# Patient Record
Sex: Male | Born: 1985 | Race: White | Hispanic: No | Marital: Married | Smoking: Never smoker
Health system: Southern US, Community
[De-identification: ages and names within clinical notes are randomized; demographics above are authoritative.]

## PROBLEM LIST (undated history)

## (undated) HISTORY — PX: MOUTH SURGERY: SHX715

---

## 2014-09-08 ENCOUNTER — Encounter (HOSPITAL_BASED_OUTPATIENT_CLINIC_OR_DEPARTMENT_OTHER): Payer: Self-pay | Admitting: *Deleted

## 2014-09-08 ENCOUNTER — Emergency Department (HOSPITAL_BASED_OUTPATIENT_CLINIC_OR_DEPARTMENT_OTHER)
Admission: EM | Admit: 2014-09-08 | Discharge: 2014-09-08 | Disposition: A | Discharge status: Home / Self Care | Payer: BLUE CROSS/BLUE SHIELD | Attending: Emergency Medicine | Admitting: Emergency Medicine

## 2014-09-08 ENCOUNTER — Emergency Department (HOSPITAL_BASED_OUTPATIENT_CLINIC_OR_DEPARTMENT_OTHER): Payer: BLUE CROSS/BLUE SHIELD

## 2014-09-08 DIAGNOSIS — R112 Nausea with vomiting, unspecified: Secondary | ICD-10-CM | POA: Insufficient documentation

## 2014-09-08 DIAGNOSIS — N200 Calculus of kidney: Secondary | ICD-10-CM

## 2014-09-08 DIAGNOSIS — R1031 Right lower quadrant pain: Secondary | ICD-10-CM | POA: Insufficient documentation

## 2014-09-08 DIAGNOSIS — R630 Anorexia: Secondary | ICD-10-CM | POA: Diagnosis not present

## 2014-09-08 DIAGNOSIS — R109 Unspecified abdominal pain: Secondary | ICD-10-CM | POA: Diagnosis present

## 2014-09-08 LAB — URINE MICROSCOPIC-ADD ON

## 2014-09-08 LAB — URINALYSIS, ROUTINE W REFLEX MICROSCOPIC
Bilirubin Urine: NEGATIVE
Glucose, UA: NEGATIVE mg/dL
Ketones, ur: 15 mg/dL — AB
Leukocytes, UA: NEGATIVE
NITRITE: NEGATIVE
PROTEIN: NEGATIVE mg/dL
Specific Gravity, Urine: 1.024 (ref 1.005–1.030)
Urobilinogen, UA: 0.2 mg/dL (ref 0.0–1.0)
pH: 7 (ref 5.0–8.0)

## 2014-09-08 LAB — BASIC METABOLIC PANEL
Anion gap: 6 (ref 5–15)
BUN: 23 mg/dL (ref 6–23)
CHLORIDE: 107 meq/L (ref 96–112)
CO2: 24 mmol/L (ref 19–32)
CREATININE: 1.18 mg/dL (ref 0.50–1.35)
Calcium: 9.2 mg/dL (ref 8.4–10.5)
GFR calc Af Amer: >90 mL/min (ref >90)
GFR, EST NON AFRICAN AMERICAN: 83 mL/min — AB (ref >90)
Glucose, Bld: 131 mg/dL — ABNORMAL HIGH (ref 70–99)
POTASSIUM: 3.7 mmol/L (ref 3.5–5.1)
Sodium: 137 mmol/L (ref 135–145)

## 2014-09-08 MED ORDER — FENTANYL CITRATE 0.05 MG/ML IJ SOLN
50.0000 ug | Freq: Once | INTRAMUSCULAR | Status: AC
  Administered 2014-09-08: 50 ug via INTRAVENOUS
  Filled 2014-09-08: qty 2

## 2014-09-08 MED ORDER — ONDANSETRON 8 MG PO TBDP
ORAL_TABLET | ORAL | Status: AC
  Filled 2014-09-08: qty 1

## 2014-09-08 MED ORDER — KETOROLAC TROMETHAMINE 30 MG/ML IJ SOLN
30.0000 mg | Freq: Once | INTRAMUSCULAR | Status: AC
  Administered 2014-09-08: 30 mg via INTRAVENOUS
  Filled 2014-09-08: qty 1

## 2014-09-08 MED ORDER — HYDROCODONE-ACETAMINOPHEN 5-325 MG PO TABS: 1.0000 | ORAL_TABLET | ORAL | Status: AC | PRN

## 2014-09-08 MED ORDER — FENTANYL CITRATE 0.05 MG/ML IJ SOLN
50.0000 ug | Freq: Once | INTRAMUSCULAR | Status: AC
  Administered 2014-09-08: 50 ug via INTRAVENOUS

## 2014-09-08 MED ORDER — ONDANSETRON 8 MG PO TBDP
8.0000 mg | ORAL_TABLET | Freq: Once | ORAL | Status: AC
  Administered 2014-09-08: 8 mg via ORAL

## 2014-09-08 MED ORDER — SODIUM CHLORIDE 0.9 % IV BOLUS (SEPSIS)
500.0000 mL | Freq: Once | INTRAVENOUS | Status: AC
  Administered 2014-09-08: 500 mL via INTRAVENOUS

## 2014-09-08 MED ORDER — ONDANSETRON 4 MG PO TBDP: ORAL_TABLET | ORAL | Status: AC

## 2014-09-08 MED ORDER — HYDROMORPHONE HCL 1 MG/ML IJ SOLN
1.0000 mg | Freq: Once | INTRAMUSCULAR | Status: AC
  Administered 2014-09-08: 1 mg via INTRAVENOUS
  Filled 2014-09-08: qty 1

## 2014-09-08 NOTE — ED Notes (Signed)
Patient became nauseous during discharge and was given Zofran ODT

## 2014-09-08 NOTE — ED Notes (Signed)
Woke with 10/10 R groin pain radiating to R flank, reports unable to void, also nv at home prior to EMS, no h/o same or of kidney stones. NSL in place by EMS, zofran and fentanyl given, arrives restless and with 10/10 pain, no vomiting for EMS.

## 2014-09-08 NOTE — ED Provider Notes (Signed)
CSN: [id number]     Arrival date & time 09/08/14  0503 History   First MD Initiated Contact with Patient 09/08/14 [phone]     Chief Complaint  Patient presents with  . Flank Pain     (Consider location/radiation/quality/duration/timing/severity/associated sxs/prior Treatment) HPI Comments: 29 year old male with no significant medical history, no abdominal surgery history presents with right lower flank and groin pain sudden onset woke him from sleep. No history of kidney stones. Pain intermittently worse however fairly constant severe. Patient to see fentanyl on route.  Patient is a 29 y.o. male presenting with flank pain. The history is provided by the patient.  Flank Pain Associated symptoms include abdominal pain. Pertinent negatives include no chest pain, no headaches and no shortness of breath.    History reviewed. No pertinent past medical history. Past Surgical History  Procedure Laterality Date  . Mouth surgery     No family history on file. History  Substance Use Topics  . Smoking status: Never Smoker   . Smokeless tobacco: Not on file  . Alcohol Use: Yes     Comment: rare    Review of Systems  Constitutional: Positive for appetite change. Negative for fever and chills.  HENT: Negative for congestion.   Eyes: Negative for visual disturbance.  Respiratory: Negative for shortness of breath.   Cardiovascular: Negative for chest pain.  Gastrointestinal: Positive for nausea, vomiting and abdominal pain.  Genitourinary: Positive for flank pain and difficulty urinating. Negative for dysuria.  Musculoskeletal: Negative for back pain, neck pain and neck stiffness.  Skin: Negative for rash.  Neurological: Negative for light-headedness and headaches.      Allergies  Review of patient's allergies indicates no known allergies.  Home Medications   Prior to Admission medications   Medication Sig Start Date End Date Taking? Authorizing Provider  HYDROcodone-acetaminophen  (NORCO) 5-325 MG per tablet Take 1-2 tablets by mouth every 4 (four) hours as needed. 09/08/14   Enid SkeensJoshua M Caia Lofaro, MD  ondansetron (ZOFRAN ODT) 4 MG disintegrating tablet 4mg  ODT q4 hours prn nausea/vomit 09/08/14   Enid SkeensJoshua M Raeonna Milo, MD   BP 124/69 mmHg  Pulse 59  Temp(Src) 98.1 F (36.7 C) (Oral)  Resp 20  SpO2 99% Physical Exam  Constitutional: He is oriented to person, place, and time. He appears well-developed and well-nourished.  HENT:  Head: Normocephalic and atraumatic.  Eyes: Conjunctivae are normal. Right eye exhibits no discharge. Left eye exhibits no discharge.  Neck: Normal range of motion. Neck supple. No tracheal deviation present.  Cardiovascular: Normal rate and regular rhythm.   Pulmonary/Chest: Effort normal and breath sounds normal.  Abdominal: Soft. He exhibits no distension. There is tenderness (right lower quadrant/groin mild). There is no guarding.  Musculoskeletal: He exhibits no edema.  Neurological: He is alert and oriented to person, place, and time.  Skin: Skin is warm. No rash noted.  Psychiatric: He has a normal mood and affect.  Nursing note and vitals reviewed.   ED Course  Procedures (including critical care time) Emergency Focused Ultrasound Exam Limited retroperitoneal ultrasound of kidneys  Performed and interpreted by Dr. Jodi MourningZavitz Indication: flank pain Focused abdominal ultrasound with both kidneys imaged in transverse and longitudinal planes in real-time. Interpretation: mild right hydronephrosis visualized.   Images archived electronically  Labs Review Labs Reviewed  BASIC METABOLIC PANEL - Abnormal; Notable for the following:    Glucose, Bld 131 (*)    GFR calc non Af Amer 83 (*)    All other components within normal limits  URINALYSIS, ROUTINE W REFLEX MICROSCOPIC    Imaging Review Ct Renal Stone Study  09/08/2014   CLINICAL DATA:  Right groin pain radiating to the right flank at 10 p.m. Unable ovoid. Nausea and vomiting.  EXAM: CT  ABDOMEN AND PELVIS WITHOUT CONTRAST  TECHNIQUE: Multidetector CT imaging of the abdomen and pelvis was performed following the standard protocol without IV contrast.  COMPARISON:  None.  FINDINGS: The lung bases are clear.  Tiny punctate stone in the right side of the bladder wall posteriorly measuring about 2 mm diameter. This could represent a stone in the ureterovesical junction or it could represent a recently passed stone in the bladder. There is no significant hydronephrosis or hydroureter on the right but there is mild perinephric stranding. Left kidney and ureter appear normal. Bladder wall is not thickened.  Unenhanced appearance of the liver, spleen, gallbladder, pancreas, adrenal glands, abdominal aorta, inferior vena cava, and retroperitoneal lymph nodes is unremarkable. Stomach, small bowel, and colon are decompressed. No free air or free fluid in the abdomen.  Pelvis: No free or loculated pelvic fluid collections. No pelvic mass or lymphadenopathy. Prostate gland is not enlarged. Appendix is not identified. Normal alignment of the lumbar spine. No pelvic mass or lymphadenopathy.  IMPRESSION: 2 mm stone is demonstrated eye bearing in the right ureterovesical junction or recently passed into the bladder. Mild proximal obstructive changes on the right kidney in ureter.   Electronically Signed   By: Burman Nieves M.D.   On: 09/08/2014 06:30     EKG Interpretation None      MDM   Final diagnoses:  Right lower quadrant abdominal pain  Right kidney stone   Clinical concern for kidney stone, no history of similar. Patient occurred multiple IV pain medicines however still persistent pain. CT scan ordered for further delineation of pain. Bedside ultrasound mild hydronephrosis. Pain controlled in the ER, CT scan results reviewed 2 mm stone, urinalysis pending. Discussed outpatient follow-up. Results and differential diagnosis were discussed with the patient/parent/guardian. Close follow up  outpatient was discussed, comfortable with the plan.   Medications  fentaNYL (SUBLIMAZE) injection 50 mcg (50 mcg Intravenous Given 09/08/14 0505)  fentaNYL (SUBLIMAZE) injection 50 mcg (50 mcg Intravenous Given 09/08/14 0518)  ketorolac (TORADOL) 30 MG/ML injection 30 mg (30 mg Intravenous Given 09/08/14 0536)  sodium chloride 0.9 % bolus 500 mL (0 mLs Intravenous Stopped 09/08/14 0536)  HYDROmorphone (DILAUDID) injection 1 mg (1 mg Intravenous Given 09/08/14 0600)  sodium chloride 0.9 % bolus 500 mL (500 mLs Intravenous New Bag/Given 09/08/14 0656)    Filed Vitals:   09/08/14 0615 09/08/14 0630 09/08/14 0636 09/08/14 0645  BP: 133/76 122/60 133/76 124/69  Pulse: 65 63 116 59  Temp:   98.1 F (36.7 C)   TempSrc:   Oral   Resp:   20   SpO2: 100% 100% 100% 99%    Final diagnoses:  Right lower quadrant abdominal pain  Right kidney stone        Enid Skeens, MD 09/08/14 [phone]

## 2014-09-08 NOTE — ED Notes (Signed)
Pt in CT.

## 2014-09-08 NOTE — ED Notes (Signed)
Dr. Jodi MourningZavitz in to speak with pt, update with results and plan. [facility] understands need for urine. VSS.

## 2014-09-08 NOTE — Discharge Instructions (Signed)
If you were given medicines take as directed.  If you are on coumadin or contraceptives realize their levels and effectiveness is altered by many different medicines.  If you have any reaction (rash, tongues swelling, other) to the medicines stop taking and see a physician.   Please follow up as directed and return to the ER or see a physician for new or worsening symptoms.  Thank you. Filed Vitals:   09/08/14 0515 09/08/14 0530 09/08/14 0545 09/08/14 0636  BP: 134/67 131/84 132/71 133/76  Pulse: 64 59 61 116  Temp: 98.7 F (37.1 C)   98.1 F (36.7 C)  TempSrc: Oral   Oral  Resp:    20  SpO2: 100% 100% 100% 100%

## 2014-09-08 NOTE — ED Notes (Signed)
Patient stated he will try to give urine asap.

## 2015-06-22 IMAGING — CT CT RENAL STONE PROTOCOL
2 of 4 series · 16 of 46 positions shown, 18 images · non-contrast
Comparison: None.

CLINICAL DATA: Right groin pain radiating to the right flank at 10
p.m.. Unable ovoid. Nausea and vomiting.

EXAM:
CT ABDOMEN AND PELVIS WITHOUT CONTRAST
TECHNIQUE: Multidetector CT imaging of the abdomen and pelvis was performed
following the standard protocol without IV contrast.

[Series 2: renal stone < 200 lbs 5.0 b31f · axial · 0.72mm/px · z∈[+711,+1126]mm · 13 of 91 slices shown, 15 images]
[im 4/91  soft-tissue]
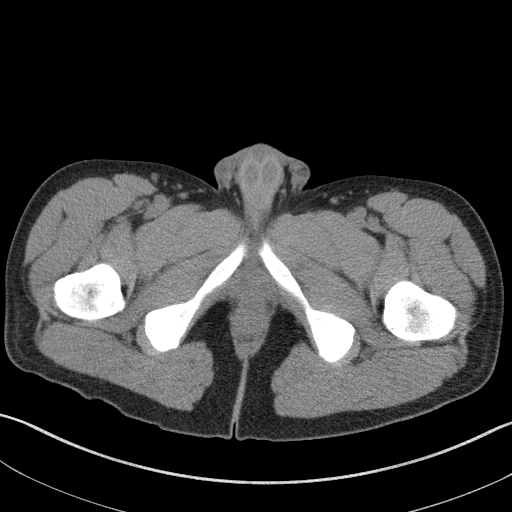
[im 4/91  bone]
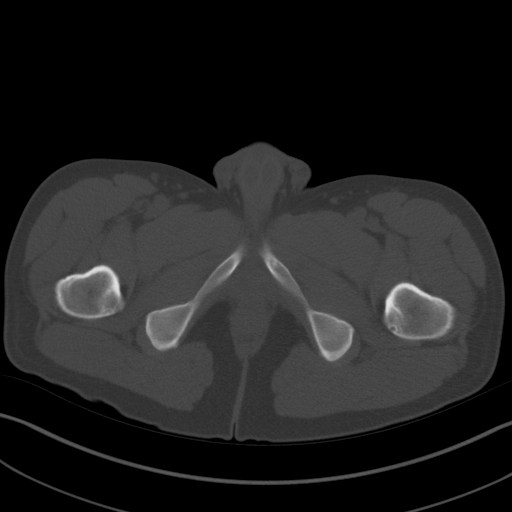
[im 12/91  soft-tissue]
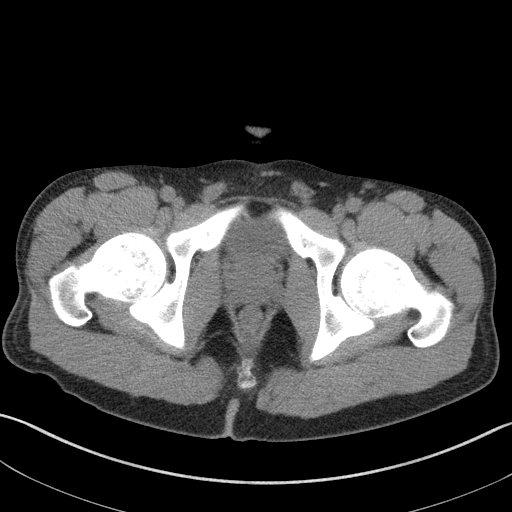
[im 20/91  soft-tissue]
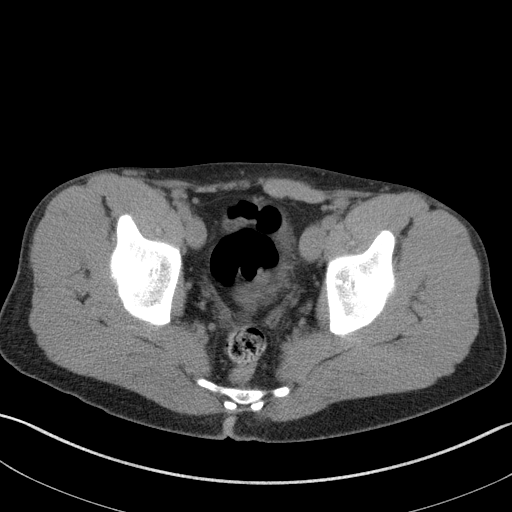
[im 24/91  soft-tissue]
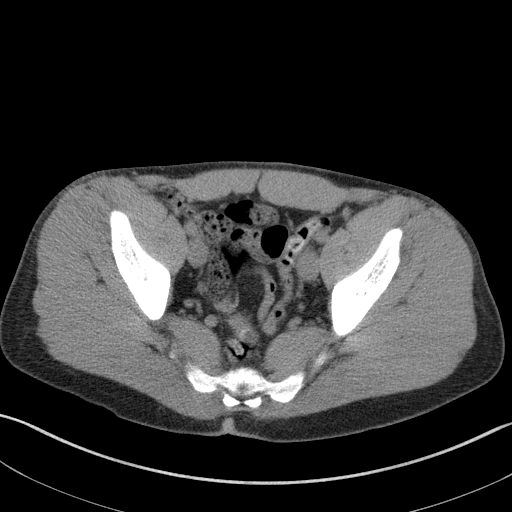
[im 32/91  soft-tissue]
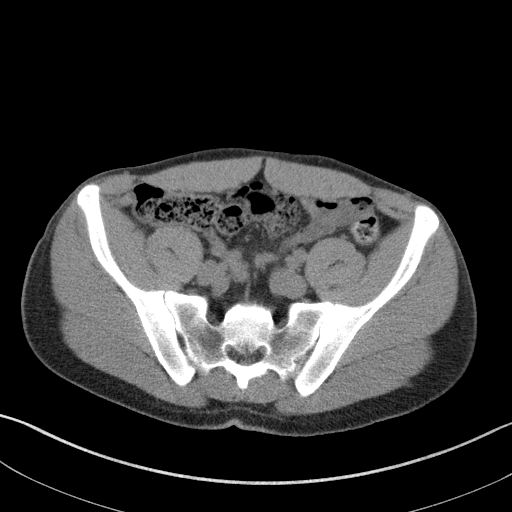
[im 40/91  soft-tissue]
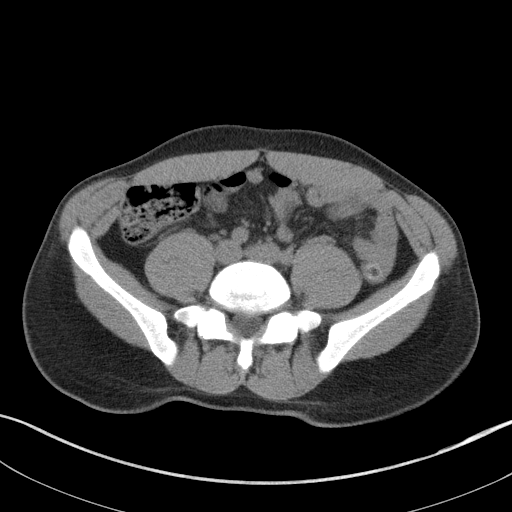
[im 47/91  soft-tissue]
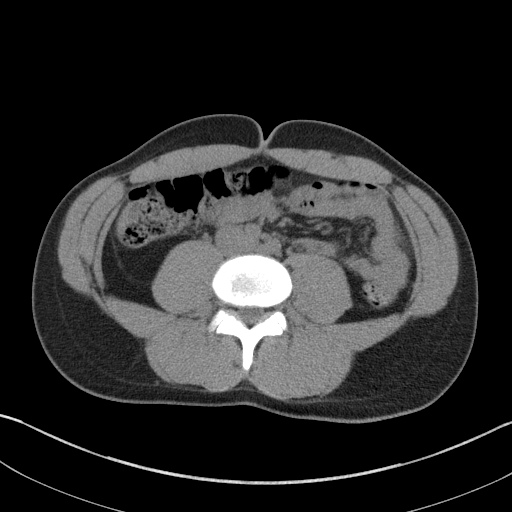
[im 51/91  soft-tissue]
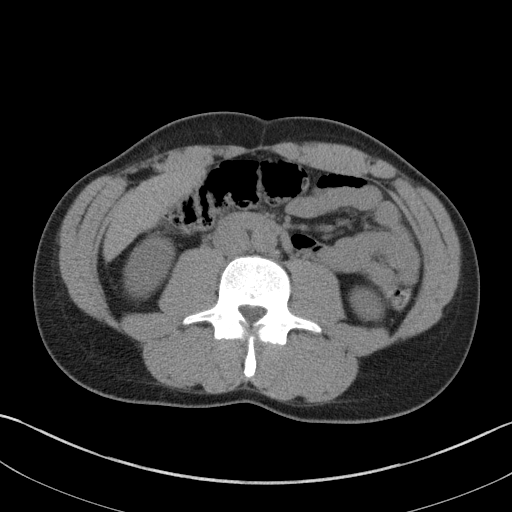
[im 59/91  soft-tissue]
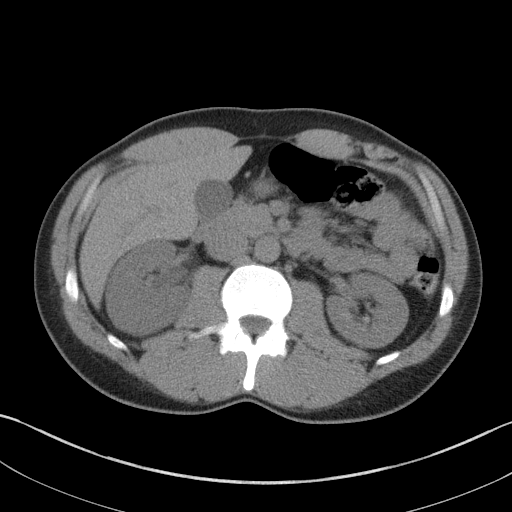
[im 59/91  bone]
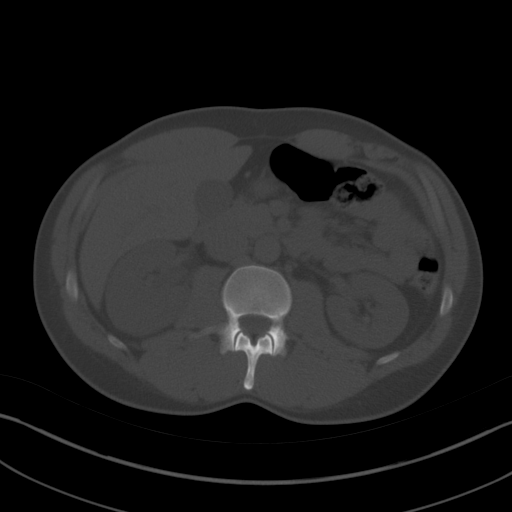
[im 67/91  soft-tissue]
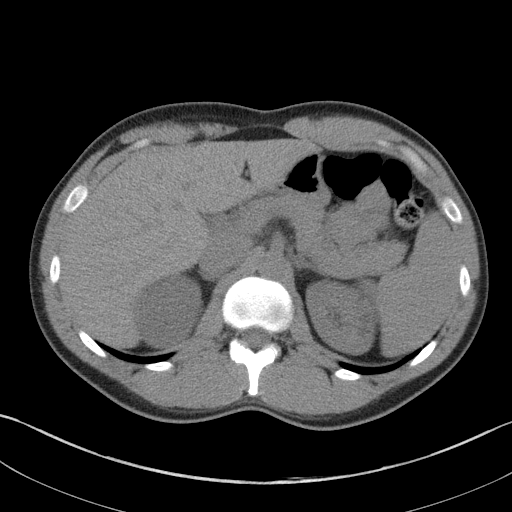
[im 71/91  soft-tissue]
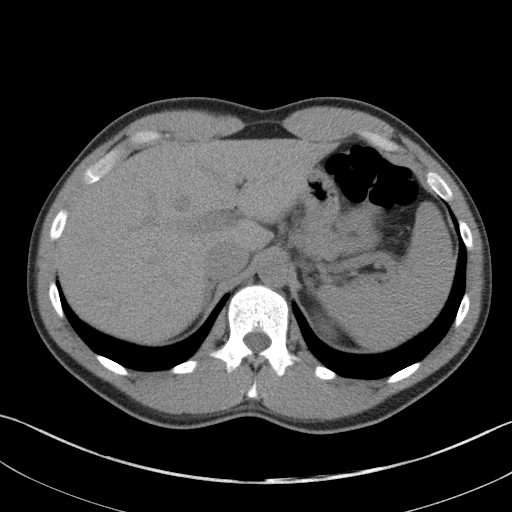
[im 79/91  soft-tissue]
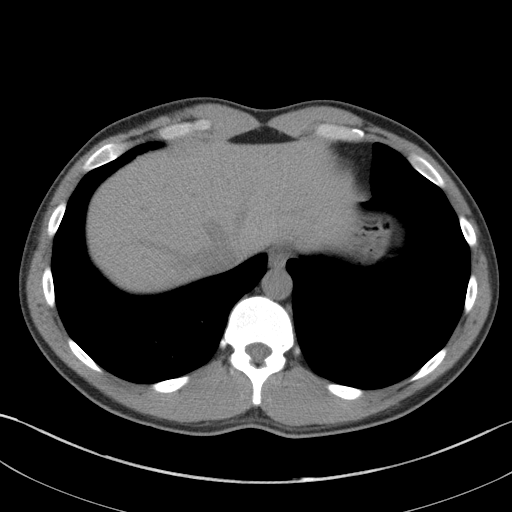
[im 87/91  soft-tissue]
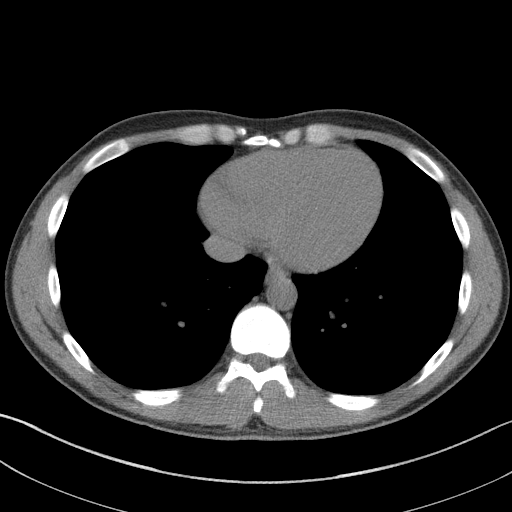

[Series 5: renal stone 3.0 coronal · coronal · 0.66mm/px · 3 of 77 slices shown]
[im 26/77  soft-tissue]
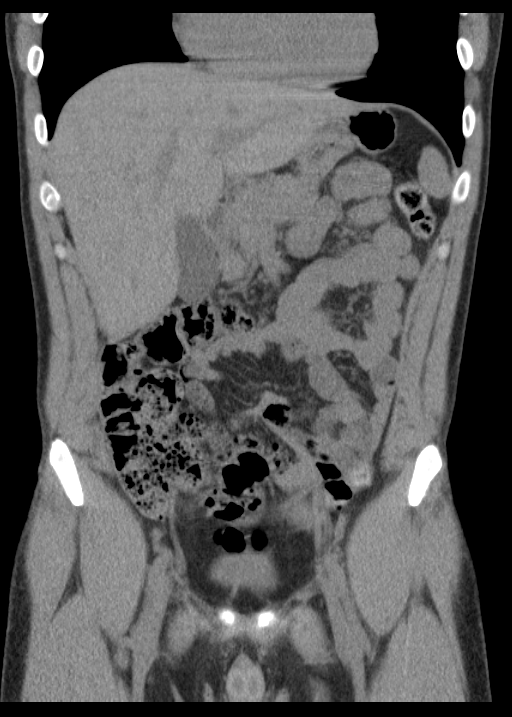
[im 34/77  soft-tissue]
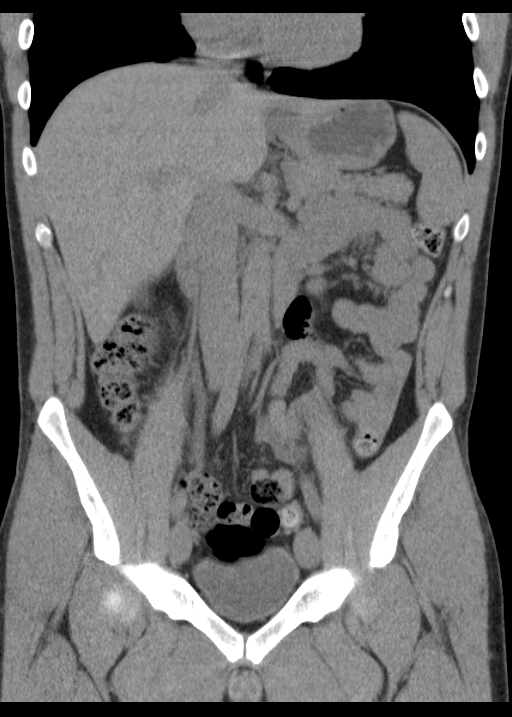
[im 43/77  soft-tissue]
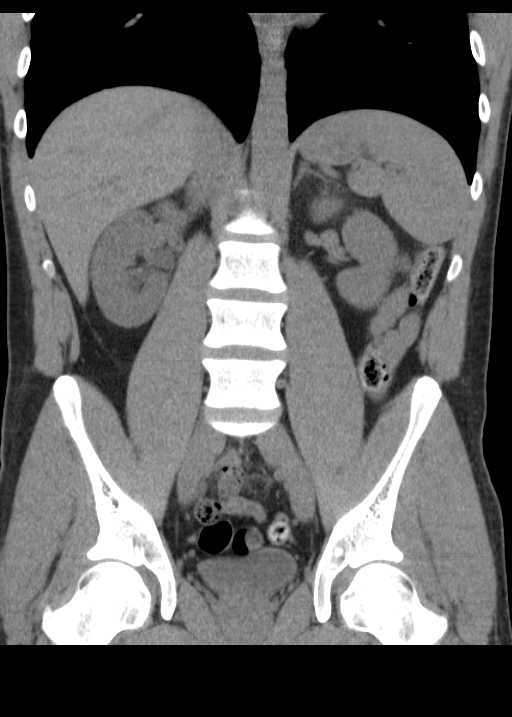

[16 of 46 positions shown; findings below may reference images not displayed]

FINDINGS: The lung bases are clear.

Tiny punctate stone in the right side of the bladder wall
posteriorly measuring about 2 mm diameter. This could represent a
stone in the ureterovesical junction or it could represent a
recently passed stone in the bladder. There is no significant
hydronephrosis or hydroureter on the right but there is mild
perinephric stranding. Left kidney and ureter appear normal. Bladder
wall is not thickened.

Unenhanced appearance of the liver, spleen, gallbladder, pancreas,
adrenal glands, abdominal aorta, inferior vena cava, and
retroperitoneal lymph nodes is unremarkable. Stomach, small bowel,
and colon are decompressed. No free air or free fluid in the
abdomen.

Pelvis: No free or loculated pelvic fluid collections. No pelvic
mass or lymphadenopathy. Prostate gland is not enlarged. Appendix is
not identified. Normal alignment of the lumbar spine. No pelvic mass
or lymphadenopathy.
IMPRESSION: 2 mm stone is demonstrated eye bearing in the right ureterovesical
junction or recently passed into the bladder. Mild proximal
obstructive changes on the right kidney in ureter.
